# Patient Record
Sex: Male | Born: 1995 | Race: Asian | Hispanic: No | Marital: Single | Smoking: Current every day smoker
Health system: Southern US, Community
[De-identification: ages and names within clinical notes are randomized; demographics above are authoritative.]

---
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
## 2019-08-25 ENCOUNTER — Other Ambulatory Visit: Payer: Self-pay

## 2019-08-25 ENCOUNTER — Emergency Department: Payer: Self-pay

## 2019-08-25 ENCOUNTER — Emergency Department
Admission: EM | Admit: 2019-08-25 | Discharge: 2019-08-25 | Disposition: A | Discharge status: Home / Self Care | Payer: Self-pay | Attending: Emergency Medicine | Admitting: Emergency Medicine

## 2019-08-25 ENCOUNTER — Encounter: Payer: Self-pay | Admitting: Emergency Medicine

## 2019-08-25 DIAGNOSIS — R0789 Other chest pain: Secondary | ICD-10-CM | POA: Insufficient documentation

## 2019-08-25 DIAGNOSIS — F17210 Nicotine dependence, cigarettes, uncomplicated: Secondary | ICD-10-CM | POA: Insufficient documentation

## 2019-08-25 DIAGNOSIS — R0781 Pleurodynia: Secondary | ICD-10-CM

## 2019-08-25 MED ORDER — NAPROXEN 500 MG PO TABS: 500.0000 mg | ORAL_TABLET | Freq: Two times a day (BID) | ORAL | 0 refills | Status: DC

## 2019-08-25 MED ORDER — NAPROXEN 500 MG PO TABS
500.0000 mg | ORAL_TABLET | Freq: Once | ORAL | Status: AC
  Administered 2019-08-25: 500 mg via ORAL
  Filled 2019-08-25: qty 1

## 2019-08-25 NOTE — Discharge Instructions (Signed)
Follow-up with your primary care provider or can no clinic acute care if any continued problems.  Begin taking naproxen 500 mg twice daily with food.  Consider discontinuing or reducing your smoking.  Today you may also apply ice to your ribs to help with pain.

## 2019-08-25 NOTE — ED Notes (Signed)
Pt presentation discussed with EDP, Stafford; no new orders at this time. 

## 2019-08-25 NOTE — ED Notes (Signed)
Po meds given as ordered.

## 2019-08-25 NOTE — ED Provider Notes (Signed)
[facility] Emergency Department Provider Note   ____________________________________________   First MD Initiated Contact with Patient 08/25/19 1807     (approximate)  I have reviewed the triage vital signs and the nursing notes.   HISTORY  Chief Complaint Chest Pain   HPI William Ewing is a 23 y.o. male presents to the ED with complaint of rib pain that started when he woke up this morning.  Patient states there is been no injury while at work.  He has not taken any over-the-counter anti-inflammatories.  He states that he went to work and during the day he felt it again and went to the first-aid station who called EMS.  He states an EKG was done and he was told that it was normal.  He states that rib pain is worse with deep inspiration.  There is no radiation and pain is intermittent.  He denies any anterior chest wall pain, shortness of breath, nausea, diaphoresis or vomiting.  Patient denies any use of drugs recreationally.  Patient is a smoker at 1/2 pack cigarettes per day.  Currently rates his pain as 5/10.       History reviewed. No pertinent past medical history.  There are no active problems to display for this patient.   History reviewed. No pertinent surgical history.  Prior to Admission medications   Medication Sig Start Date End Date Taking? Authorizing Provider  naproxen (NAPROSYN) 500 MG tablet Take 1 tablet (500 mg total) by mouth 2 (two) times daily with a meal. 08/25/19   Tommi RumpsSummers, Rhonda L, PA-C    Allergies Patient has no known allergies.  No family history on file.  Social History Social History   Tobacco Use  . Smoking status: Current Every Day Smoker    Packs/day: 0.50    Types: Cigarettes  . Smokeless tobacco: Never Used  Substance Use Topics  . Alcohol use: Yes  . Drug use: Never    Review of Systems Constitutional: No fever/chills Eyes: No visual changes. ENT: No sore throat. Cardiovascular: Denies chest  pain. Respiratory: Denies shortness of breath.  Left lateral rib pain. Gastrointestinal: No abdominal pain.  No nausea, no vomiting.  No diarrhea.  No constipation. Genitourinary: Negative for dysuria. Musculoskeletal: Negative for back pain. Skin: Negative for rash. Neurological: Negative for headaches, focal weakness or numbness. ____________________________________________   PHYSICAL EXAM:  VITAL SIGNS: ED Triage Vitals  Enc Vitals Group     BP 08/25/19 1727 131/71     Pulse Rate 08/25/19 1727 75     Resp 08/25/19 1727 16     Temp 08/25/19 1727 98.2 F (36.8 C)     Temp Source 08/25/19 1727 Oral     SpO2 08/25/19 1727 97 %     Weight 08/25/19 1722 150 lb (68 kg)     Height 08/25/19 1722 5\' 10"  (1.778 m)     Head Circumference --      Peak Flow --      Pain Score 08/25/19 1722 5     Pain Loc --      Pain Edu? --      Excl. in [place]? --    Constitutional: Alert and oriented. Well appearing and in no acute distress. Eyes: Conjunctivae are normal.  Head: Atraumatic. Neck: No stridor.   Cardiovascular: Normal rate, regular rhythm. Grossly normal heart sounds.  Good peripheral circulation. Respiratory: Normal respiratory effort.  No retractions. Lungs CTAB.  No gross deformities noted on the left lateral ribs.  No soft tissue edema or discoloration is noted.  There is tenderness on palpation of the left lateral rib at approximately 8, ninth rib.  Range of motion increases patient's pain. Gastrointestinal: Soft and nontender. No distention. Musculoskeletal: Moves upper and lower extremities without any difficulty and normal gait was noted. Neurologic:  Normal speech and language. No gross focal neurologic deficits are appreciated. No gait instability. Skin:  Skin is warm, dry and intact. No rash noted. Psychiatric: Mood and affect are normal. Speech and behavior are normal.  ____________________________________________   LABS (all labs ordered are listed, but only abnormal  results are displayed)  Labs Reviewed - No data to display ____________________________________________  EKG  EKG was read by Dr. on Major.  EKG showed normal sinus rhythm with a ventricular rate of 77.  PR interval 146, QRS duration 100. ____________________________________________  RADIOLOGY  Official radiology report(s): Dg Chest 2 View  Result Date: 08/25/2019 CLINICAL DATA:  Acute LEFT chest and LOWER rib pain. EXAM: CHEST - 2 VIEW COMPARISON:  None. FINDINGS: The cardiomediastinal silhouette is unremarkable. There is no evidence of focal airspace disease, pulmonary edema, suspicious pulmonary nodule/mass, pleural effusion, or pneumothorax. No acute bony abnormalities are identified. IMPRESSION: No active cardiopulmonary disease. Electronically Signed   By: Margarette Canada M.D.   On: 08/25/2019 18:16    ____________________________________________   PROCEDURES  Procedure(s) performed (including Critical Care):  Procedures   ____________________________________________   INITIAL IMPRESSION / ASSESSMENT AND PLAN / ED COURSE  As part of my medical decision making, I reviewed the following data within the electronic MEDICAL RECORD NUMBER Notes from prior ED visits and [place] Controlled Substance Database  23 year old male presents to the ED with complaint of left lateral rib pain when he woke up this morning.  He denies any injury to this area.  He was at work when he became concerned and went to the first-aid station who called EMS.  They did an EKG that looked normal.  Patient was not transferred ported to the ED but came after work.  Chest x-ray was negative and EKG was reassuring.  Physical exam showed tenderness on palpation of the left lateral ribs approximately the eighth and ninth.  Patient was given naproxen in the ED and a prescription to continue with same.  He is to return to the emergency department if any severe worsening of his symptoms or urgent concerns.   ____________________________________________   FINAL CLINICAL IMPRESSION(S) / ED DIAGNOSES  Final diagnoses:  Rib pain on left side     ED Discharge Orders         Ordered    naproxen (NAPROSYN) 500 MG tablet  2 times daily with meals     08/25/19 1838           Note:  This document was prepared using Dragon voice recognition software and may include unintentional dictation errors.    Johnn Hai, PA-C 08/25/19 1911    Blake Divine, MD 08/25/19 2025

## 2019-08-25 NOTE — ED Triage Notes (Addendum)
Pt in via POV, reports intermittent left lower rib pain, denies radiation, denies associated symptoms.  States pain is sharp upon taking a deep breath, denies any recent injury.  Ambulatory to triage, NAD noted at this time.

## 2019-08-25 NOTE — ED Notes (Signed)
Sharp pain in chest area earlier today. Was told by ems his ekg was normal and he should go to ed and get a chest xray make sure his lungs are ok. Reports pain calm down only feels it when taking deep breaths.

## 2019-12-06 ENCOUNTER — Ambulatory Visit: Payer: Self-pay

## 2019-12-16 ENCOUNTER — Other Ambulatory Visit: Payer: Self-pay

## 2019-12-16 ENCOUNTER — Ambulatory Visit (LOCAL_COMMUNITY_HEALTH_CENTER): Payer: Self-pay

## 2019-12-16 ENCOUNTER — Ambulatory Visit: Payer: Self-pay | Admitting: Physician Assistant

## 2019-12-16 DIAGNOSIS — Z202 Contact with and (suspected) exposure to infections with a predominantly sexual mode of transmission: Secondary | ICD-10-CM

## 2019-12-16 DIAGNOSIS — Z23 Encounter for immunization: Secondary | ICD-10-CM

## 2019-12-16 DIAGNOSIS — Z113 Encounter for screening for infections with a predominantly sexual mode of transmission: Secondary | ICD-10-CM

## 2019-12-16 LAB — GRAM STAIN

## 2019-12-16 MED ORDER — AZITHROMYCIN 500 MG PO TABS
1000.0000 mg | ORAL_TABLET | Freq: Once | ORAL | Status: AC
  Administered 2019-12-16: 09:00:00 1000 mg via ORAL

## 2019-12-16 NOTE — Progress Notes (Signed)
   [facility] Department STI clinic/screening visit  Subjective:  William Ewing is a 23 y.o. male being seen today for an STI screening visit. The patient reports they do not have symptoms.    Patient has the following medical conditions:  There are no problems to display for this patient.    Chief Complaint  Patient presents with  . SEXUALLY TRANSMITTED DISEASE    HPI  Patient reports that he does not have any symptoms but is a contact to Chlamydia.  Declines blood work today, any chronic conditions and history of surgery.   See flowsheet for further details and programmatic requirements.    The following portions of the patient's history were reviewed and updated as appropriate: allergies, current medications, past medical history, past social history, past surgical history and problem list.  Objective:  There were no vitals filed for this visit.  Physical Exam Constitutional:      General: He is not in acute distress.    Appearance: Normal appearance. He is normal weight.  HENT:     Head: Normocephalic and atraumatic.     Comments: No nits, lice, or hair loss. No cervical, supraclavicular or axillary adenopathy.    Mouth/Throat:     Mouth: Mucous membranes are moist.     Pharynx: Oropharynx is clear. No oropharyngeal exudate or posterior oropharyngeal erythema.  Eyes:     Conjunctiva/sclera: Conjunctivae normal.  Pulmonary:     Effort: Pulmonary effort is normal.  Abdominal:     Palpations: Abdomen is soft. There is no mass.     Tenderness: There is no abdominal tenderness. There is no guarding or rebound.  Genitourinary:    Penis: Normal.      Testes: Normal.     Comments: Pubic area without nits, lice, edema, erythema, lesions and inguinal adenopathy. Penis circumcised and without discharge at meatus. Musculoskeletal:     Cervical back: Neck supple. No tenderness.  Skin:    General: Skin is warm and dry.     Findings: No bruising, erythema,  lesion or rash.  Neurological:     Mental Status: He is alert and oriented to person, place, and time.  Psychiatric:        Mood and Affect: Mood normal.        Thought Content: Thought content normal.        Judgment: Judgment normal.       Assessment and Plan:  William Ewing is a 23 y.o. male presenting to the [facility] Department for STI screening  1. Screening for STD (sexually transmitted disease) Patient into clinic without symptoms today.  Declines blood work today. Rec condoms with all sex. Await test results.  Counseled that RN will call if needs to RTC for further treatment once results are back. - Gram stain - Gonococcus culture - Gonococcus culture  2. Chlamydia contact Will treat as a contact to Chlamydia with Azithromycin 1 g po DOT today. No sex for 7 days and until after partner completes treatment. RTC for re-treatment if vomits <2 hr after taking medicine. - azithromycin (ZITHROMAX) tablet 1,000 mg  3.  Immunizations Patient requests tetanus update today. Please give as appropriate.    No follow-ups on file.  No future appointments.  Jerene Dilling, PA

## 2019-12-16 NOTE — Progress Notes (Signed)
Pt received Tdap vaccine today x1 per pt request and per Lauretta Chester, MD verbal order. Pt tolerated well.Ronny Bacon, RN

## 2019-12-16 NOTE — Progress Notes (Signed)
Gram stain reviewed and is negative today. Pt treated as a contact to Chlamydia per provider order. Pt received Tdap vaccine today per pt request, please see immunization encounter for today (12/16/2019) for details. Provider orders completed.Ronny Bacon, RN

## 2019-12-17 ENCOUNTER — Encounter: Payer: Self-pay | Admitting: Physician Assistant

## 2019-12-21 LAB — GONOCOCCUS CULTURE

## 2022-04-21 ENCOUNTER — Emergency Department: Payer: Commercial Managed Care - PPO

## 2022-04-21 ENCOUNTER — Emergency Department
Admission: EM | Admit: 2022-04-21 | Discharge: 2022-04-21 | Disposition: A | Discharge status: Home / Self Care | Payer: Commercial Managed Care - PPO | Attending: Emergency Medicine | Admitting: Emergency Medicine

## 2022-04-21 ENCOUNTER — Encounter: Payer: Self-pay | Admitting: Emergency Medicine

## 2022-04-21 ENCOUNTER — Other Ambulatory Visit: Payer: Self-pay

## 2022-04-21 DIAGNOSIS — M25512 Pain in left shoulder: Secondary | ICD-10-CM | POA: Insufficient documentation

## 2022-04-21 DIAGNOSIS — G8929 Other chronic pain: Secondary | ICD-10-CM | POA: Diagnosis not present

## 2022-04-21 MED ORDER — NAPROXEN 500 MG PO TABS: 500.0000 mg | ORAL_TABLET | Freq: Two times a day (BID) | ORAL | 0 refills | Status: AC

## 2022-04-21 NOTE — ED Triage Notes (Signed)
Pt via POV from home. Pt c/o L shoulder pain for the past 6 months. States he hears a "popping" noise every once in a while. States nothing happened today but he just decided to get it checked out today. Pt is A&Ox4 and NAD ?

## 2022-04-21 NOTE — ED Notes (Signed)
See triage note  presents with left shoulder pain for about 6 months  denies any falls  states he has increased pain with movement  no deformity noted  good pulses ?

## 2022-04-21 NOTE — ED Provider Notes (Signed)
? ?[facility] ?Provider Note ? ? ? Event Date/Time  ? First MD Initiated Contact with Patient 04/21/22 0848   ?  (approximate) ? ? ?History  ? ?Shoulder Pain ? ? ?HPI ? ?William Ewing is a 26 y.o. male presents to the ED with complaint of left shoulder pain for 6 months.  Patient is dates that he hears a "popping noise".  He denies any difficulty with range of motion.  No over-the-counter medications have been taken.  This is the first visit for evaluation of this problem. ?  ? ? ?Physical Exam  ? ?Triage Vital Signs: ?ED Triage Vitals  ?Enc Vitals Group  ?   BP 04/21/22 0843 (!) 141/71  ?   Pulse Rate 04/21/22 0843 75  ?   Resp 04/21/22 0843 15  ?   Temp 04/21/22 0843 (!) 97.5 ?F (36.4 ?C)  ?   Temp Source 04/21/22 0843 Oral  ?   SpO2 04/21/22 0843 96 %  ?   Weight 04/21/22 0834 185 lb (83.9 kg)  ?   Height 04/21/22 0834 5\' 10"  (1.778 m)  ?   Head Circumference --   ?   Peak Flow --   ?   Pain Score 04/21/22 0834 2  ?   Pain Loc --   ?   Pain Edu? --   ?   Excl. in [place]? --   ? ? ?Most recent vital signs: ?Vitals:  ? 04/21/22 0843  ?BP: (!) 141/71  ?Pulse: 75  ?Resp: 15  ?Temp: (!) 97.5 ?F (36.4 ?C)  ?SpO2: 96%  ? ? ? ?General: Awake, no distress.  ?CV:  Good peripheral perfusion.  Heart regular rate and rhythm. ?Resp:  Normal effort.  Lungs are clear bilaterally. ?Abd:  No distention.  ?Other:  Left shoulder no gross deformity is noted.  Range of motion is without restriction.  There is occasional crepitus with range of motion that is not consistent.   ? ? ?ED Results / Procedures / Treatments  ? ?Labs ?(all labs ordered are listed, but only abnormal results are displayed) ?Labs Reviewed - No data to display ? ? ? ?RADIOLOGY ?Left shoulder x-ray images were reviewed independently of the radiologist and no fracture, dislocation or osteophytes noted. ? ? ? ?PROCEDURES: ? ?Critical Care performed:  ? ?Procedures ? ? ?MEDICATIONS ORDERED IN ED: ?Medications - No data to display ? ? ?IMPRESSION  / MDM / ASSESSMENT AND PLAN / ED COURSE  ?I reviewed the triage vital signs and the nursing notes. ? ? ?Differential diagnosis includes, but is not limited to, chronic left shoulder pain, left shoulder osteoarthritis. ? ?26 year old male presents to the ED with complaint of a popping in his left shoulder that has been going on for approximately 6 months without history of injury.  Patient does not take any over-the-counter medication for this.  Physical exam was benign with the exception of some minimal crepitus that is not consistent.  X-ray of the left shoulder was negative for bony abnormalities.  We discussed over-the-counter medication however a prescription for naproxen 500 mg twice daily was sent to his pharmacy.  He was told that this is not bone on bone that he is experiencing.  He is encouraged to follow-up with Dr. Leim Fabry who is on-call for orthopedics if he continues having problems in the future. ? ? ? ?  ? ? ?FINAL CLINICAL IMPRESSION(S) / ED DIAGNOSES  ? ?Final diagnoses:  ?Chronic left shoulder pain  ? ? ? ?  Rx / DC Orders  ? ?ED Discharge Orders   ? ?      Ordered  ?  naproxen (NAPROSYN) 500 MG tablet  2 times daily with meals       ? 04/21/22 0937  ? ?  ?  ? ?  ? ? ? ?Note:  This document was prepared using Dragon voice recognition software and may include unintentional dictation errors. ?  ?Johnn Hai, PA-C ?04/21/22 [id number] ? ?  ?Vanessa [place], MD ?04/25/22 1515 ? ?

## 2022-04-21 NOTE — Discharge Instructions (Signed)
Follow-up with Dr. Signa Kell who is on-call for orthopedics if not improving or still giving you difficulties after 3 to 4 weeks.  You will need to call make an appointment.  Naproxen 500 mg twice daily with food was sent to your pharmacy. ?

## 2022-09-01 IMAGING — CR DG SHOULDER 2+V*L*
1 series · 3 of 3 positions shown · non-contrast
Comparison: None.

CLINICAL DATA: Left shoulder pain

EXAM:
LEFT SHOULDER - 2+ VIEW

[Series 1: dg shoulder left · 0.14mm/px · 3 of 3 slices shown]
[im 1/3]
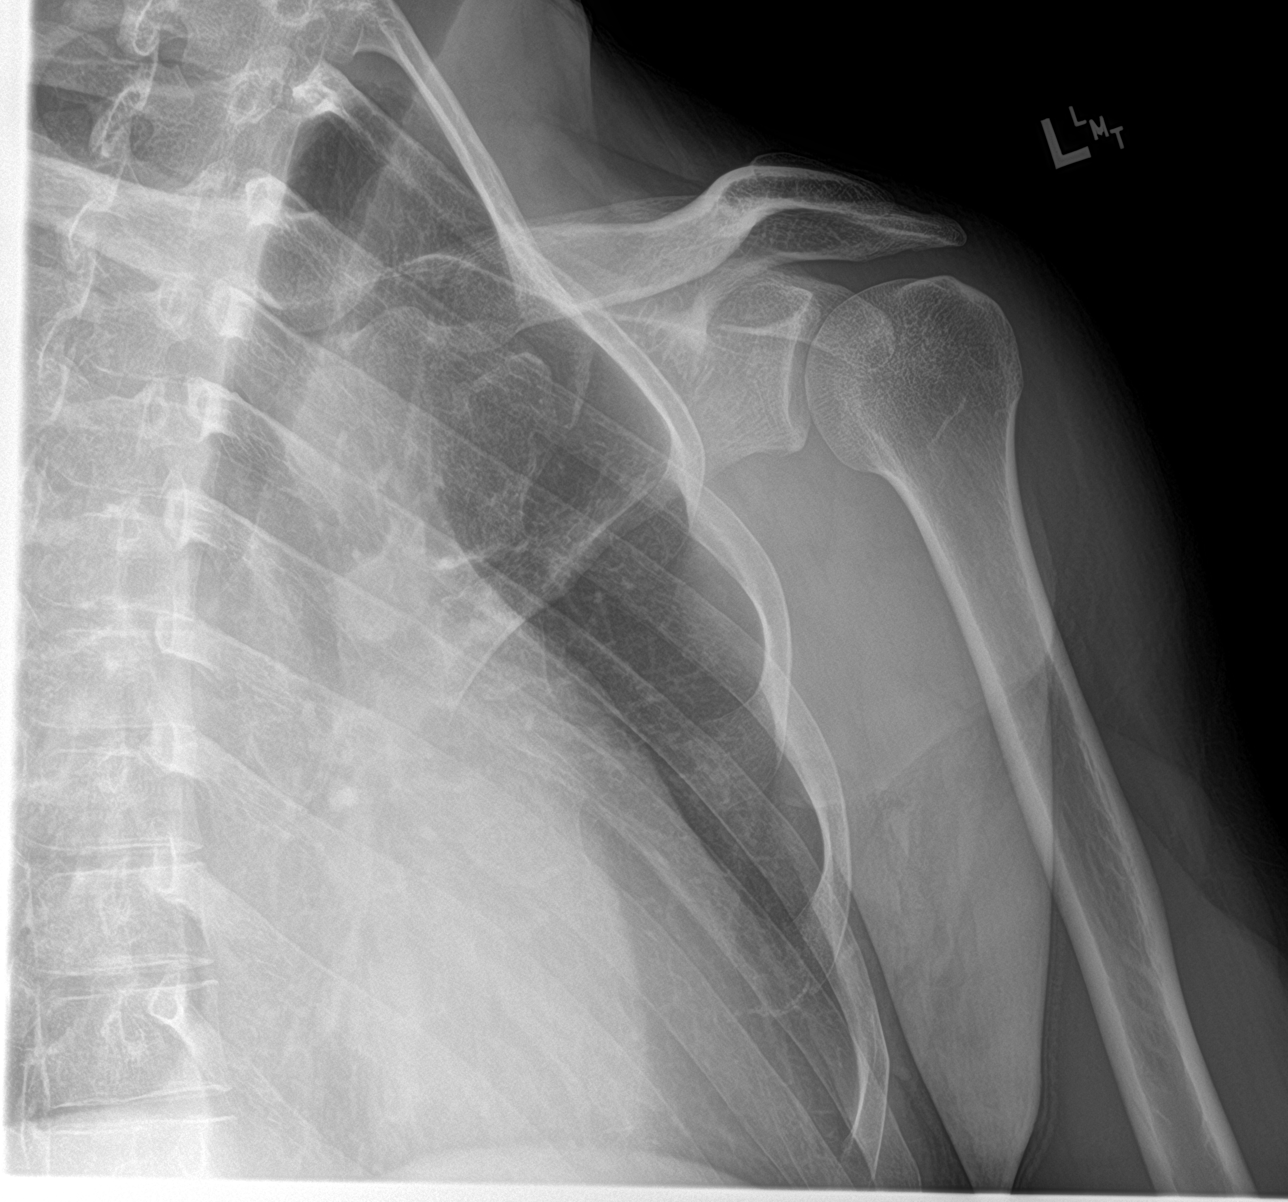
[im 2/3]
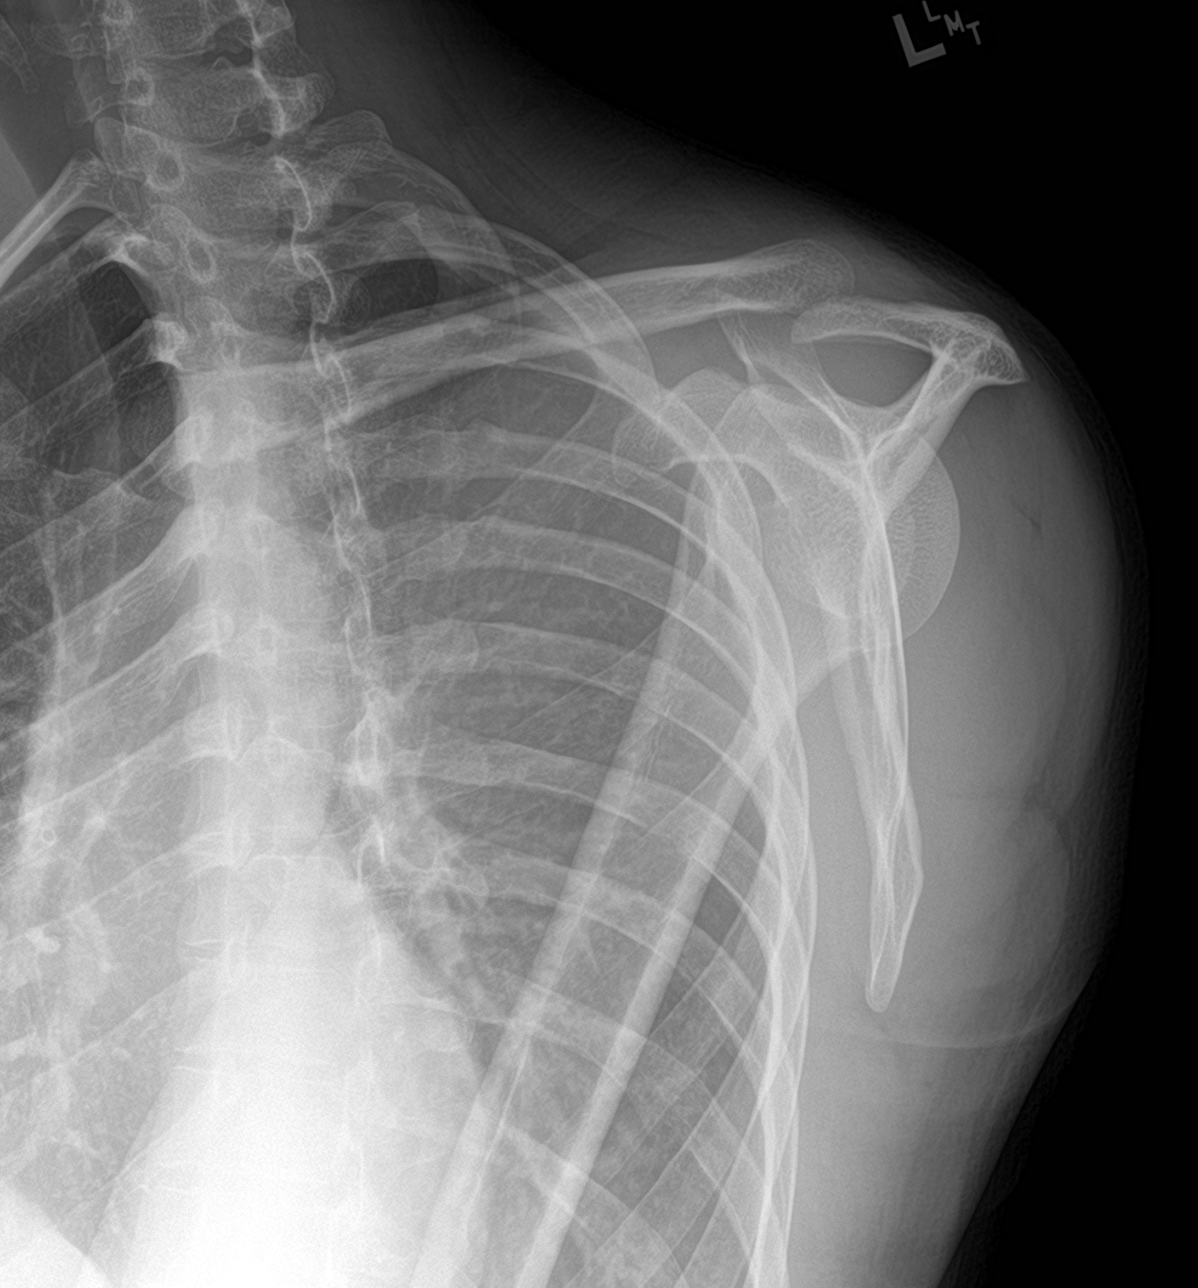
[im 3/3]
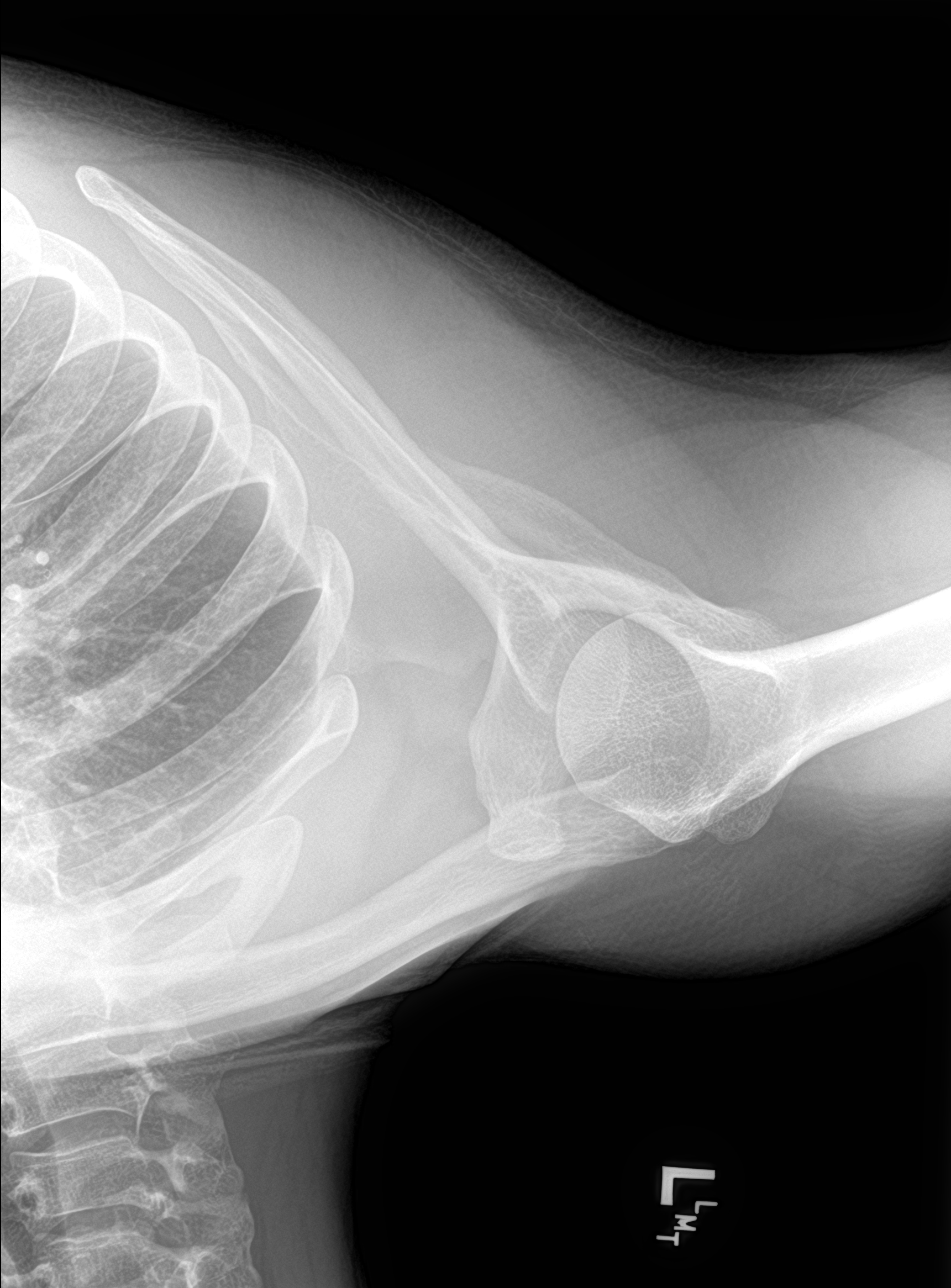

[3 of 3 positions shown; findings below may reference images not displayed]

FINDINGS: There is no evidence of fracture or dislocation. There is no
evidence of arthropathy or other focal bone abnormality. Soft
tissues are unremarkable.
IMPRESSION: Negative left shoulder radiographs.

## 2023-03-22 ENCOUNTER — Emergency Department
Admission: EM | Admit: 2023-03-22 | Discharge: 2023-03-22 | Disposition: A | Discharge status: Home / Self Care | Payer: Commercial Managed Care - PPO | Attending: Emergency Medicine | Admitting: Emergency Medicine

## 2023-03-22 DIAGNOSIS — F1014 Alcohol abuse with alcohol-induced mood disorder: Secondary | ICD-10-CM | POA: Diagnosis present

## 2023-03-22 DIAGNOSIS — R45851 Suicidal ideations: Secondary | ICD-10-CM

## 2023-03-22 DIAGNOSIS — D72829 Elevated white blood cell count, unspecified: Secondary | ICD-10-CM | POA: Insufficient documentation

## 2023-03-22 DIAGNOSIS — Z046 Encounter for general psychiatric examination, requested by authority: Secondary | ICD-10-CM

## 2023-03-22 DIAGNOSIS — F10920 Alcohol use, unspecified with intoxication, uncomplicated: Secondary | ICD-10-CM

## 2023-03-22 DIAGNOSIS — Y907 Blood alcohol level of 200-239 mg/100 ml: Secondary | ICD-10-CM | POA: Insufficient documentation

## 2023-03-22 LAB — CBC WITH DIFFERENTIAL/PLATELET
Abs Immature Granulocytes: 0.04 10*3/uL (ref 0.00–0.07)
Basophils Absolute: 0.1 10*3/uL (ref 0.0–0.1)
Basophils Relative: 1 %
Eosinophils Absolute: 0 10*3/uL (ref 0.0–0.5)
Eosinophils Relative: 0 %
HCT: 44.1 % (ref 39.0–52.0)
Hemoglobin: 14.7 g/dL (ref 13.0–17.0)
Immature Granulocytes: 0 %
Lymphocytes Relative: 21 %
Lymphs Abs: 2.8 10*3/uL (ref 0.7–4.0)
MCH: 29.6 pg (ref 26.0–34.0)
MCHC: 33.3 g/dL (ref 30.0–36.0)
MCV: 88.7 fL (ref 80.0–100.0)
Monocytes Absolute: 0.5 10*3/uL (ref 0.1–1.0)
Monocytes Relative: 4 %
Neutro Abs: 9.6 10*3/uL — ABNORMAL HIGH (ref 1.7–7.7)
Neutrophils Relative %: 74 %
Platelets: 182 10*3/uL (ref 150–400)
RBC: 4.97 MIL/uL (ref 4.22–5.81)
RDW: 13.2 % (ref 11.5–15.5)
WBC: 12.9 10*3/uL — ABNORMAL HIGH (ref 4.0–10.5)
nRBC: 0 % (ref 0.0–0.2)

## 2023-03-22 LAB — COMPREHENSIVE METABOLIC PANEL
ALT: 23 U/L (ref 0–44)
AST: 20 U/L (ref 15–41)
Albumin: 4.6 g/dL (ref 3.5–5.0)
Alkaline Phosphatase: 61 U/L (ref 38–126)
Anion gap: 7 (ref 5–15)
BUN: 15 mg/dL (ref 6–20)
CO2: 20 mmol/L — ABNORMAL LOW (ref 22–32)
Calcium: 9 mg/dL (ref 8.9–10.3)
Chloride: 114 mmol/L — ABNORMAL HIGH (ref 98–111)
Creatinine, Ser: 1 mg/dL (ref 0.61–1.24)
GFR, Estimated: >60 mL/min (ref >60)
Glucose, Bld: 110 mg/dL — ABNORMAL HIGH (ref 70–99)
Potassium: 3.7 mmol/L (ref 3.5–5.1)
Sodium: 141 mmol/L (ref 135–145)
Total Bilirubin: 0.4 mg/dL (ref 0.3–1.2)
Total Protein: 8 g/dL (ref 6.5–8.1)

## 2023-03-22 LAB — SALICYLATE LEVEL: Salicylate Lvl: <7 mg/dL — ABNORMAL LOW (ref 7.0–30.0)

## 2023-03-22 LAB — ACETAMINOPHEN LEVEL: Acetaminophen (Tylenol), Serum: <10 ug/mL — ABNORMAL LOW (ref 10–30)

## 2023-03-22 LAB — ETHANOL: Alcohol, Ethyl (B): 208 mg/dL — ABNORMAL HIGH (ref <10)

## 2023-03-22 NOTE — ED Notes (Signed)
This RN attempted to have patient changed. Pt not cooperative and cussing at this RN. PT states he is not getting naked for anyone. This RN and police attempted to explain the policy and process, and patient refused. Pt stating being here for 2 hours and wants to go home. Police stated pt is under IVC. This RN informed patient that this RN is unable to let him leave, but is trying to help the process. Pt continued to yell and cuss. First nurse informed.  Pt refusing to dress out into hospital approved clothing/ purple scrubs.

## 2023-03-22 NOTE — ED Notes (Signed)
Pts Father picked him up, security escorted pt to the exit.

## 2023-03-22 NOTE — ED Notes (Signed)
Pt continues to scream and yell and being disruptive in the subwait area. Instructed officers and pt to go into the family room at this time.

## 2023-03-22 NOTE — Consult Note (Signed)
[facility] Consult   Reason for Consult:  alcohol intoxication with suicide comment upon arrest Referring Physician:  EDP Patient Identification: William Ewing MRN:  [id number] Principal Diagnosis: Alcohol abuse with alcohol-induced mood disorder ([place]) Diagnosis:  Principal Problem:   Alcohol abuse with alcohol-induced mood disorder ([place])   Total Time spent with patient: 45 minutes  Subjective:   William Ewing is a 27 y.o. male patient admitted with suicide statement in the process of being arrested for a DWI.  HPI:  27 yo male presented to the ED by police after he was stopped and arrested for a DWI.  "They just pissed me off and I said things I didn't mean.  I'm not going to hurt myself, I love myself.  I just want to go home and go to bed."  The client denies suicidal/homicidal ideations, hallucinations, and history of withdrawal complications including seizures.  No past suicide attempts or hospitalizations or mental health issues.  Psych cleared.  Past Psychiatric History: none  Risk to Self:  none Risk to Others:  none Prior Inpatient Therapy:  none Prior Outpatient Therapy:  none  Past Medical History: No past medical history on file. No past surgical history on file. Family History: No family history on file. Family Psychiatric  History: denies Social History:  Social History   Substance and Sexual Activity  Alcohol Use Yes     Social History   Substance and Sexual Activity  Drug Use Never    Social History   Socioeconomic History   Marital status: Single    Spouse name: Not on file   Number of children: Not on file   Years of education: Not on file   Highest education level: Not on file  Occupational History   Not on file  Tobacco Use   Smoking status: Every Day    Packs/day: .5    Types: Cigarettes   Smokeless tobacco: Never  Vaping Use   Vaping Use: Never used  Substance and Sexual Activity   Alcohol use: Yes   Drug use: Never    Sexual activity: Yes    Birth control/protection: Condom  Other Topics Concern   Not on file  Social History Narrative   Not on file   Social Determinants of Health   Financial Resource Strain: Not on file  Food Insecurity: Not on file  Transportation Needs: Not on file  Physical Activity: Not on file  Stress: Not on file  Social Connections: Not on file   Additional Social History:    Allergies:  No Known Allergies  Labs:  Results for orders placed or performed during the hospital encounter of 03/22/23 (from the past 48 hour(s))  Comprehensive metabolic panel     Status: Abnormal   Collection Time: 03/22/23  5:56 AM  Result Value Ref Range   Sodium 141 135 - 145 mmol/L   Potassium 3.7 3.5 - 5.1 mmol/L   Chloride 114 (H) 98 - 111 mmol/L   CO2 20 (L) 22 - 32 mmol/L   Glucose, Bld 110 (H) 70 - 99 mg/dL    Comment: Glucose reference range applies only to samples taken after fasting for at least 8 hours.   BUN 15 6 - 20 mg/dL   Creatinine, Ser 1.00 0.61 - 1.24 mg/dL   Calcium 9.0 8.9 - 10.3 mg/dL   Total Protein 8.0 6.5 - 8.1 g/dL   Albumin 4.6 3.5 - 5.0 g/dL   AST 20 15 - 41 U/L  ALT 23 0 - 44 U/L   Alkaline Phosphatase 61 38 - 126 U/L   Total Bilirubin 0.4 0.3 - 1.2 mg/dL   GFR, Estimated >60 >60 mL/min    Comment: (NOTE) Calculated using the CKD-EPI Creatinine Equation (2021)    Anion gap 7 5 - 15    Comment: Performed at [facility], [address] [place], [place] [address]  Ethanol     Status: Abnormal   Collection Time: 03/22/23  5:56 AM  Result Value Ref Range   Alcohol, Ethyl (B) 208 (H) <10 mg/dL    Comment: (NOTE) Lowest detectable limit for serum alcohol is 10 mg/dL.  For medical purposes only. Performed at [facility], [address] [place], [place] [address]   CBC with Diff     Status: Abnormal   Collection Time: 03/22/23  5:56 AM  Result Value Ref Range   WBC 12.9 (H) 4.0 - 10.5 K/uL   RBC 4.97 4.22 - 5.81 MIL/uL    Hemoglobin 14.7 13.0 - 17.0 g/dL   HCT 44.1 39.0 - 52.0 %   MCV 88.7 80.0 - 100.0 fL   MCH 29.6 26.0 - 34.0 pg   MCHC 33.3 30.0 - 36.0 g/dL   RDW 13.2 11.5 - 15.5 %   Platelets 182 150 - 400 K/uL   nRBC 0.0 0.0 - 0.2 %   Neutrophils Relative % 74 %   Neutro Abs 9.6 (H) 1.7 - 7.7 K/uL   Lymphocytes Relative 21 %   Lymphs Abs 2.8 0.7 - 4.0 K/uL   Monocytes Relative 4 %   Monocytes Absolute 0.5 0.1 - 1.0 K/uL   Eosinophils Relative 0 %   Eosinophils Absolute 0.0 0.0 - 0.5 K/uL   Basophils Relative 1 %   Basophils Absolute 0.1 0.0 - 0.1 K/uL   Immature Granulocytes 0 %   Abs Immature Granulocytes 0.04 0.00 - 0.07 K/uL    Comment: Performed at [facility], [address] [place], [place] [id number]  Salicylate level     Status: Abnormal   Collection Time: 03/22/23  5:56 AM  Result Value Ref Range   Salicylate Lvl Q000111Q (L) 7.0 - 30.0 mg/dL    Comment: Performed at [facility], [address] [place], [place] [address]  Acetaminophen level     Status: Abnormal   Collection Time: 03/22/23  5:56 AM  Result Value Ref Range   Acetaminophen (Tylenol), Serum <10 (L) 10 - 30 ug/mL    Comment: (NOTE) Therapeutic concentrations vary significantly. A range of 10-30 ug/mL  may be an effective concentration for many patients. However, some  are best treated at concentrations outside of this range. Acetaminophen concentrations >150 ug/mL at 4 hours after ingestion  and >50 ug/mL at 12 hours after ingestion are often associated with  toxic reactions.  Performed at [facility], [address] [place],  [address]     No current facility-administered medications for this encounter.   Current Outpatient Medications  Medication Sig Dispense Refill   naproxen (NAPROSYN) 500 MG tablet Take 1 tablet (500 mg total) by mouth 2 (two) times daily with a meal. 30 tablet 0    Musculoskeletal: Strength & Muscle Tone: within normal limits Gait & Station:  normal Patient leans: N/A  Psychiatric Specialty Exam: Physical Exam Vitals and nursing note reviewed.  Constitutional:      Appearance: Normal appearance.  HENT:     Head: Normocephalic.     Nose: Nose normal.  Pulmonary:     Effort: Pulmonary effort is normal.  Musculoskeletal:        General: Normal range of motion.     Cervical back: Normal range of motion.  Neurological:     General: No focal deficit present.     Mental Status: He is alert and oriented to person, place, and time.  Psychiatric:        Attention and Perception: Attention and perception normal.        Mood and Affect: Mood is anxious.        Speech: Speech normal.        Behavior: Behavior normal. Behavior is cooperative.        Thought Content: Thought content normal.        Cognition and Memory: Cognition and memory normal.        Judgment: Judgment normal.     Review of Systems  Psychiatric/Behavioral:  Positive for substance abuse. The patient is nervous/anxious.   All other systems reviewed and are negative.   Blood pressure (!) 136/90, pulse (!) 115, temperature 97.9 F (36.6 C), temperature source Oral, resp. rate 20, height 5\' 10"  (1.778 m), weight 72.6 kg, SpO2 98 %.Body mass index is 22.96 kg/m.  General Appearance: Disheveled  Eye Contact:  Fair  Speech:  Normal Rate  Volume:  Normal  Mood:  Anxious and Irritable  Affect:  Congruent  Thought Process:  Coherent  Orientation:  Full (Time, Place, and Person)  Thought Content:  WDL and Logical  Suicidal Thoughts:  No  Homicidal Thoughts:  No  Memory:  Immediate;   Good Recent;   Good Remote;   Good  Judgement:  Fair  Insight:  Fair  Psychomotor Activity:  Normal  Concentration:  Concentration: Fair and Attention Span: Fair  Recall:  [employer] of Knowledge:  Fair  Language:  Good  Akathisia:  No  Handed:  Right  AIMS (if indicated):     Assets:  Housing Leisure Time Physical Health Resilience Social Support  ADL's:  Intact   Cognition:  WNL  Sleep:        Physical Exam: Physical Exam Vitals and nursing note reviewed.  Constitutional:      Appearance: Normal appearance.  HENT:     Head: Normocephalic.     Nose: Nose normal.  Pulmonary:     Effort: Pulmonary effort is normal.  Musculoskeletal:        General: Normal range of motion.     Cervical back: Normal range of motion.  Neurological:     General: No focal deficit present.     Mental Status: He is alert and oriented to person, place, and time.  Psychiatric:        Attention and Perception: Attention and perception normal.        Mood and Affect: Mood is anxious.        Speech: Speech normal.        Behavior: Behavior normal. Behavior is cooperative.        Thought Content: Thought content normal.        Cognition and Memory: Cognition and memory normal.        Judgment: Judgment normal.    Review of Systems  Psychiatric/Behavioral:  Positive for substance abuse. The patient is nervous/anxious.   All other systems reviewed and are negative.  Blood pressure (!) 136/90, pulse (!) 115, temperature 97.9 F (36.6 C), temperature source Oral, resp. rate 20, height 5\' 10"  (1.778 m), weight  72.6 kg, SpO2 98 %. Body mass index is 22.96 kg/m.  Treatment Plan Summary: Alcohol abuse with alcohol induced mood disorder: Follow up with [facility]  Disposition: No evidence of imminent risk to self or others at present.   Patient does not meet criteria for psychiatric inpatient admission. Supportive therapy provided about ongoing stressors.  Waylan Boga, NP 03/22/2023 9:54 AM

## 2023-03-22 NOTE — ED Notes (Signed)
Patient verbally aggressive with staff, but has agreed to blood work while awaiting treatment room.

## 2023-03-22 NOTE — ED Provider Notes (Signed)
Patient with cleared by psychiatry for discharge home.  Will make sure that he has a safe ride home.  IVC was reversed by them.   Vanessa Shonto, MD 03/22/23 [phone]

## 2023-03-22 NOTE — ED Notes (Signed)
Patient is refusing vitals, blood work, or being dressed out at this time

## 2023-03-22 NOTE — ED Notes (Signed)
Patient was compliant with lab draws and vitals. Due to safety concerns, patient must remain in handcuffs per officers, so unable to be dressed out at this time.

## 2023-03-22 NOTE — ED Notes (Signed)
Pt refused to dress out at this time.

## 2023-03-22 NOTE — ED Notes (Signed)
Pt belongings given by [employer] include:  2 black phone Barrister's clerk Black wallet

## 2023-03-22 NOTE — ED Notes (Signed)
Ride almost here. Spoke with Tylers dad.

## 2023-03-22 NOTE — ED Provider Notes (Signed)
[facility] Provider Note    Event Date/Time   First MD Initiated Contact with Patient 03/22/23 0559     (approximate)   History   Psychiatric Evaluation   HPI  William Ewing is a 27 y.o. male with no known past medical history who presents to the emergency department in police custody.  Patient is under IVC.  Patient was arrested for DWI tonight and taken to jail.  Patient repeatedly stating that he was going to kill himself which was witnessed by multiple police and staff members at the jail and "they would not have to worry about him anymore".  He admits to having access to guns, knives.  He denies SI currently, HI but is extremely intoxicated, agitated and difficult to redirect.   History provided by patient, police.    No past medical history on file.  No past surgical history on file.  MEDICATIONS:  Prior to Admission medications   Medication Sig Start Date End Date Taking? Authorizing Provider  naproxen (NAPROSYN) 500 MG tablet Take 1 tablet (500 mg total) by mouth 2 (two) times daily with a meal. 04/21/22   Johnn Hai, PA-C    Physical Exam   Triage Vital Signs: ED Triage Vitals  Enc Vitals Group     BP 03/22/23 0603 (!) 136/90     Pulse Rate 03/22/23 0603 (!) 115     Resp 03/22/23 0603 20     Temp 03/22/23 0603 97.9 F (36.6 C)     Temp Source 03/22/23 0603 Oral     SpO2 03/22/23 0603 98 %     Weight 03/22/23 0538 160 lb (72.6 kg)     Height 03/22/23 0538 5\' 10"  (1.778 m)     Head Circumference --      Peak Flow --      Pain Score --      Pain Loc --      Pain Edu? --      Excl. in [place]? --     Most recent vital signs: Vitals:   03/22/23 0603  BP: (!) 136/90  Pulse: (!) 115  Resp: 20  Temp: 97.9 F (36.6 C)  SpO2: 98%    CONSTITUTIONAL: Alert, responds appropriately to questions. Well-appearing; well-nourished, smells strongly of alcohol HEAD: Normocephalic, atraumatic EYES: Conjunctivae clear, pupils appear  equal, sclera nonicteric ENT: normal nose; moist mucous membranes NECK: Supple, normal ROM CARD: Regular and tachycardic; S1 and S2 appreciated RESP: Normal chest excursion without splinting or tachypnea; breath sounds clear and equal bilaterally; no wheezes, no rhonchi, no rales, no hypoxia or respiratory distress, speaking full sentences ABD/GI: Non-distended; soft, non-tender, no rebound, no guarding, no peritoneal signs BACK: The back appears normal EXT: Normal ROM in all joints; no deformity noted, no edema SKIN: Normal color for age and race; warm; no rash on exposed skin NEURO: Moves all extremities equally, slurred speech, ambulates with normal gait PSYCH: Agitated, loud, difficult to redirect.  Denies SI, HI.   ED Results / Procedures / Treatments   LABS: (all labs ordered are listed, but only abnormal results are displayed) Labs Reviewed  COMPREHENSIVE METABOLIC PANEL - Abnormal; Notable for the following components:      Result Value   Chloride 114 (*)    CO2 20 (*)    Glucose, Bld 110 (*)    All other components within normal limits  ETHANOL - Abnormal; Notable for the following components:   Alcohol, Ethyl (B) 208 (*)  All other components within normal limits  CBC WITH DIFFERENTIAL/PLATELET - Abnormal; Notable for the following components:   WBC 12.9 (*)    Neutro Abs 9.6 (*)    All other components within normal limits  SALICYLATE LEVEL - Abnormal; Notable for the following components:   Salicylate Lvl Q000111Q (*)    All other components within normal limits  ACETAMINOPHEN LEVEL - Abnormal; Notable for the following components:   Acetaminophen (Tylenol), Serum <10 (*)    All other components within normal limits  URINE DRUG SCREEN, QUALITATIVE ([facility] ONLY)     EKG:   RADIOLOGY: My personal review and interpretation of imaging:    I have personally reviewed all radiology reports.   No results found.   PROCEDURES:  Critical Care performed:  No      Procedures    IMPRESSION / MDM / ASSESSMENT AND PLAN / ED COURSE  I reviewed the triage vital signs and the nursing notes.    Patient here under IVC by [facility] Department after he made multiple statements regarding having suicidal thoughts after being arrested and placed in jail for DWI.     DIFFERENTIAL DIAGNOSIS (includes but not limited to):   Alcohol intoxication, malingering, depression, suicidal thoughts, aggressive behavior   Patient's presentation is most consistent with acute presentation with potential threat to life or bodily function.   PLAN: Will obtain screening labs, urine.  Patient is under full IVC.  Given these statements that he made to multiple staff members at jail (although likely in the setting of being intoxicated and frustrated that he was arrested for DWI) with access to weapons, will consult psychiatry and TTS for further evaluation and disposition.  Police present with patient as he is currently stated and in custody.   MEDICATIONS GIVEN IN ED: Medications - No data to display   ED COURSE: Labs show slight leukocytosis likely reactive.  Alcohol level of 208.    Tylenol and salicylate levels negative.    Drug screen pending.    Patient medically cleared and awaiting psychiatric evaluation.   CONSULTS: TTS and psychiatry consulted for further disposition.   OUTSIDE RECORDS REVIEWED: Reviewed last internal medicine note by Sarajane Jews on 03/11/2023.       FINAL CLINICAL IMPRESSION(S) / ED DIAGNOSES   Final diagnoses:  Suicidal ideation  Involuntary commitment  Alcoholic intoxication without complication ([place])     Rx / DC Orders   ED Discharge Orders     None        Note:  This document was prepared using Dragon voice recognition software and may include unintentional dictation errors.   Talulah Schirmer, Delice Bison, DO 03/22/23 [phone]

## 2023-03-22 NOTE — Discharge Instructions (Signed)
Cleared by psychiatry to be discharged to a ride.

## 2023-03-22 NOTE — ED Notes (Signed)
Called for a ride. Waiting on their arrival for this DC

## 2023-03-22 NOTE — ED Triage Notes (Signed)
Patient arrived with [facility] after being charged with a DWI. While patient was in custody, he endorsed suicidal ideations. Patient denies suicidal and homicidal ideations at this time. He is also refusing vitals at this time.

## 2023-09-23 ENCOUNTER — Emergency Department
Admission: EM | Admit: 2023-09-23 | Discharge: 2023-09-24 | Disposition: A | Discharge status: Home / Self Care | Payer: Commercial Managed Care - PPO | Attending: Emergency Medicine | Admitting: Emergency Medicine

## 2023-09-23 DIAGNOSIS — Y92410 Unspecified street and highway as the place of occurrence of the external cause: Secondary | ICD-10-CM | POA: Insufficient documentation

## 2023-09-23 DIAGNOSIS — S40812A Abrasion of left upper arm, initial encounter: Secondary | ICD-10-CM | POA: Insufficient documentation

## 2023-09-23 DIAGNOSIS — Z79899 Other long term (current) drug therapy: Secondary | ICD-10-CM | POA: Diagnosis not present

## 2023-09-23 DIAGNOSIS — S0990XA Unspecified injury of head, initial encounter: Secondary | ICD-10-CM | POA: Insufficient documentation

## 2023-09-23 DIAGNOSIS — S40811A Abrasion of right upper arm, initial encounter: Secondary | ICD-10-CM | POA: Diagnosis not present

## 2023-09-23 DIAGNOSIS — S4991XA Unspecified injury of right shoulder and upper arm, initial encounter: Secondary | ICD-10-CM | POA: Diagnosis present

## 2023-09-23 DIAGNOSIS — S299XXA Unspecified injury of thorax, initial encounter: Secondary | ICD-10-CM | POA: Insufficient documentation

## 2023-09-23 DIAGNOSIS — Y906 Blood alcohol level of 120-199 mg/100 ml: Secondary | ICD-10-CM | POA: Diagnosis not present

## 2023-09-23 DIAGNOSIS — S3991XA Unspecified injury of abdomen, initial encounter: Secondary | ICD-10-CM | POA: Diagnosis not present

## 2023-09-23 DIAGNOSIS — F10920 Alcohol use, unspecified with intoxication, uncomplicated: Secondary | ICD-10-CM | POA: Diagnosis not present

## 2023-09-23 MED ORDER — LACTATED RINGERS IV BOLUS
1000.0000 mL | Freq: Once | INTRAVENOUS | Status: AC
  Administered 2023-09-24: 1000 mL via INTRAVENOUS
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
## 2023-09-23 NOTE — ED Provider Notes (Signed)
   [facility] Provider Note    Event Date/Time   First MD Initiated Contact with Patient 09/23/23 2343     (approximate)   History   Chief Complaint No chief complaint on file.   HPI  William Ewing is a 27 y.o. male ***       Physical Exam   Triage Vital Signs: ED Triage Vitals  Encounter Vitals Group     BP      Systolic BP Percentile      Diastolic BP Percentile      Pulse      Resp      Temp      Temp src      SpO2      Weight      Height      Head Circumference      Peak Flow      Pain Score      Pain Loc      Pain Education      Exclude from Growth Chart     Most recent vital signs: There were no vitals filed for this visit.  Constitutional: Alert and oriented. Eyes: Conjunctivae are normal. Head: Atraumatic. Nose: No congestion/rhinnorhea. Mouth/Throat: Mucous membranes are moist.  Neck: *** Cardiovascular: Normal rate, regular rhythm. Grossly normal heart sounds.  2+ radial pulses bilaterally. Respiratory: Normal respiratory effort.  No retractions. Lungs CTAB. Gastrointestinal: Soft and nontender. No distention. Genitourinary: *** Musculoskeletal: No lower extremity tenderness nor edema.  Neurologic:  Normal speech and language. No gross focal neurologic deficits are appreciated.    ED Results / Procedures / Treatments   Labs (all labs ordered are listed, but only abnormal results are displayed) Labs Reviewed - No data to display   EKG  ED ECG REPORT I, Chesley Noon, the attending physician, personally viewed and interpreted this ECG.   Date: 09/23/2023  EKG Time: ***  Rate: ***  Rhythm: {ekg findings:315101}  Axis: ***  Intervals:{conduction defects:17367}  ST&T Change: ***  RADIOLOGY ***  PROCEDURES:  Critical Care performed: {CriticalCareYesNo:19197::"Yes, see critical care procedure note(s)","No"}  Procedures   MEDICATIONS ORDERED IN ED: Medications - No data to  display   IMPRESSION / MDM / ASSESSMENT AND PLAN / ED COURSE  I reviewed the triage vital signs and the nursing notes.                              27 y.o. male ***   Patient's presentation is most consistent with {EM COPA:27473}  Differential diagnosis includes, but is not limited to, ***  ***  {**The patient is on the cardiac monitor to evaluate for evidence of arrhythmia and/or significant heart rate changes.**}      FINAL CLINICAL IMPRESSION(S) / ED DIAGNOSES   Final diagnoses:  None     Rx / DC Orders   ED Discharge Orders     None        Note:  This document was prepared using Dragon voice recognition software and may include unintentional dictation errors.

## 2023-09-24 ENCOUNTER — Emergency Department: Payer: Commercial Managed Care - PPO

## 2023-09-24 ENCOUNTER — Other Ambulatory Visit: Payer: Self-pay

## 2023-09-24 LAB — URINE DRUG SCREEN, QUALITATIVE (ARMC ONLY)
Amphetamines, Ur Screen: NOT DETECTED
Barbiturates, Ur Screen: NOT DETECTED
Benzodiazepine, Ur Scrn: NOT DETECTED
Cannabinoid 50 Ng, Ur ~~LOC~~: POSITIVE — AB
Cocaine Metabolite,Ur ~~LOC~~: NOT DETECTED
MDMA (Ecstasy)Ur Screen: NOT DETECTED
Methadone Scn, Ur: NOT DETECTED
Opiate, Ur Screen: NOT DETECTED
Phencyclidine (PCP) Ur S: NOT DETECTED
Tricyclic, Ur Screen: NOT DETECTED

## 2023-09-24 LAB — CBC WITH DIFFERENTIAL/PLATELET
Abs Immature Granulocytes: 0.05 10*3/uL (ref 0.00–0.07)
Basophils Absolute: 0 10*3/uL (ref 0.0–0.1)
Basophils Relative: 0 %
Eosinophils Absolute: 0 10*3/uL (ref 0.0–0.5)
Eosinophils Relative: 0 %
HCT: 48.7 % (ref 39.0–52.0)
Hemoglobin: 15.9 g/dL (ref 13.0–17.0)
Immature Granulocytes: 1 %
Lymphocytes Relative: 19 %
Lymphs Abs: 2 10*3/uL (ref 0.7–4.0)
MCH: 29.1 pg (ref 26.0–34.0)
MCHC: 32.6 g/dL (ref 30.0–36.0)
MCV: 89.2 fL (ref 80.0–100.0)
Monocytes Absolute: 0.6 10*3/uL (ref 0.1–1.0)
Monocytes Relative: 6 %
Neutro Abs: 7.8 10*3/uL — ABNORMAL HIGH (ref 1.7–7.7)
Neutrophils Relative %: 74 %
Platelets: 164 10*3/uL (ref 150–400)
RBC: 5.46 MIL/uL (ref 4.22–5.81)
RDW: 14.1 % (ref 11.5–15.5)
WBC: 10.6 10*3/uL — ABNORMAL HIGH (ref 4.0–10.5)
nRBC: 0 % (ref 0.0–0.2)

## 2023-09-24 LAB — COMPREHENSIVE METABOLIC PANEL
ALT: 22 U/L (ref 0–44)
AST: 27 U/L (ref 15–41)
Albumin: 4.2 g/dL (ref 3.5–5.0)
Alkaline Phosphatase: 67 U/L (ref 38–126)
Anion gap: 10 (ref 5–15)
BUN: 8 mg/dL (ref 6–20)
CO2: 22 mmol/L (ref 22–32)
Calcium: 8.3 mg/dL — ABNORMAL LOW (ref 8.9–10.3)
Chloride: 106 mmol/L (ref 98–111)
Creatinine, Ser: 1.03 mg/dL (ref 0.61–1.24)
GFR, Estimated: >60 mL/min (ref >60)
Glucose, Bld: 92 mg/dL (ref 70–99)
Potassium: 3.5 mmol/L (ref 3.5–5.1)
Sodium: 138 mmol/L (ref 135–145)
Total Bilirubin: 0.7 mg/dL (ref 0.3–1.2)
Total Protein: 7.4 g/dL (ref 6.5–8.1)

## 2023-09-24 LAB — TYPE AND SCREEN
ABO/RH(D): A POS
Antibody Screen: NEGATIVE

## 2023-09-24 LAB — ETHANOL: Alcohol, Ethyl (B): 183 mg/dL — ABNORMAL HIGH (ref <10)

## 2023-09-24 MED ORDER — TETANUS-DIPHTH-ACELL PERTUSSIS 5-2.5-18.5 LF-MCG/0.5 IM SUSY: 0.5000 mL | PREFILLED_SYRINGE | Freq: Once | INTRAMUSCULAR | Status: DC

## 2023-09-24 MED ORDER — IOHEXOL 300 MG/ML  SOLN
100.0000 mL | Freq: Once | INTRAMUSCULAR | Status: AC | PRN
  Administered 2023-09-24: 100 mL via INTRAVENOUS

## 2023-09-24 NOTE — ED Notes (Signed)
Pt told Clinical research associate he has to urinate. Writer obtained urinal for patient to use in bed and told him we should wait for imaging results to ensure he doesn't have any fractures prior to getting out of bed d/t risk of paralysis if fractures are present. Pt proceeded to move neck aggressively in every direction and say his neck is fine. Pt refused urinal and got out of bed to use toilet.

## 2023-09-24 NOTE — ED Notes (Signed)
Writer spoke with patient's mother on the phone and gave update. Pt gave permission to give mother updates.

## 2023-09-24 NOTE — ED Notes (Signed)
Patient transported to CT with RN and [employer]

## 2023-09-24 NOTE — ED Notes (Signed)
No need to replace IV to complete fluids per Larinda Buttery, MD

## 2023-09-24 NOTE — ED Notes (Signed)
EKG shown to MD Modesto Charon and MD Larinda Buttery

## 2024-01-23 ENCOUNTER — Other Ambulatory Visit: Payer: Self-pay

## 2024-01-23 ENCOUNTER — Emergency Department
Admission: EM | Admit: 2024-01-23 | Discharge: 2024-01-23 | Disposition: A | Discharge status: Home / Self Care | Payer: Commercial Managed Care - PPO | Attending: Emergency Medicine | Admitting: Emergency Medicine

## 2024-01-23 DIAGNOSIS — S0502XA Injury of conjunctiva and corneal abrasion without foreign body, left eye, initial encounter: Secondary | ICD-10-CM | POA: Insufficient documentation

## 2024-01-23 DIAGNOSIS — X58XXXA Exposure to other specified factors, initial encounter: Secondary | ICD-10-CM | POA: Insufficient documentation

## 2024-01-23 MED ORDER — FLUORESCEIN SODIUM 1 MG OP STRP
ORAL_STRIP | OPHTHALMIC | Status: AC
  Administered 2024-01-23: 1 via OPHTHALMIC
  Filled 2024-01-23: qty 1

## 2024-01-23 MED ORDER — FLUORESCEIN SODIUM 1 MG OP STRP
1.0000 | ORAL_STRIP | Freq: Once | OPHTHALMIC | Status: AC
  Administered 2024-01-23: 1 via OPHTHALMIC

## 2024-01-23 MED ORDER — OXYCODONE-ACETAMINOPHEN 5-325 MG PO TABS: 2.0000 | ORAL_TABLET | Freq: Four times a day (QID) | ORAL | 0 refills | Status: AC | PRN

## 2024-01-23 MED ORDER — OXYCODONE-ACETAMINOPHEN 5-325 MG PO TABS
2.0000 | ORAL_TABLET | Freq: Once | ORAL | Status: AC
  Administered 2024-01-23: 2 via ORAL
  Filled 2024-01-23: qty 2

## 2024-01-23 MED ORDER — ERYTHROMYCIN 5 MG/GM OP OINT
TOPICAL_OINTMENT | OPHTHALMIC | Status: AC
  Administered 2024-01-23: 1 via OPHTHALMIC
  Filled 2024-01-23: qty 1

## 2024-01-23 MED ORDER — TETRACAINE HCL 0.5 % OP SOLN
2.0000 [drp] | Freq: Once | OPHTHALMIC | Status: AC
  Administered 2024-01-23: 2 [drp] via OPHTHALMIC
  Filled 2024-01-23: qty 4

## 2024-01-23 NOTE — Discharge Instructions (Addendum)
Use the provided antibiotic ointment by placing a thin line of the ointment on a clean finger and rolling it onto your lower left eyelid.  When you blink and look around from side-to-side and up and down it will smear the ointment over your eye which will help protect it and keep it from getting infected.  Use the ointment every 6 hours while you are awake for the next 5 days, or until it completely stops hurting.  These injuries are very painful for the first 1 to 2 days.  You can use over-the-counter ibuprofen or Tylenol as needed for pain control.  Take Percocet as prescribed for severe pain. Do not drink alcohol, drive or participate in any other potentially dangerous activities while taking this medication as it may make you sleepy. Do not take this medication with any other sedating medications, either prescription or over-the-counter. If you were prescribed Percocet or Vicodin, do not take these with acetaminophen (Tylenol) as it is already contained within these medications.   This medication is an opiate (or narcotic) pain medication and can be habit forming.  Use it as little as possible to achieve adequate pain control.  Do not use or use it with extreme caution if you have a history of opiate abuse or dependence.  If you are on a pain contract with your primary care doctor or a pain specialist, be sure to let them know you were prescribed this medication today from the [facility] Emergency Department.  This medication is intended for your use only - do not give any to anyone else and keep it in a secure place where nobody else, especially children, have access to it.  It will also cause or worsen constipation, so you may want to consider taking an over-the-counter stool softener while you are taking this medication.  We strongly encourage you to call the office of Dr. Rolley Sims at the [facility] to schedule a follow-up appointment at the next available opportunity.    Return to the  emergency department if you develop new or worsening symptoms that concern you.
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
## 2024-02-22 ENCOUNTER — Other Ambulatory Visit: Payer: Self-pay

## 2024-02-22 DIAGNOSIS — S0502XA Injury of conjunctiva and corneal abrasion without foreign body, left eye, initial encounter: Secondary | ICD-10-CM | POA: Insufficient documentation

## 2024-02-22 DIAGNOSIS — H5712 Ocular pain, left eye: Secondary | ICD-10-CM | POA: Diagnosis present

## 2024-02-22 DIAGNOSIS — H11432 Conjunctival hyperemia, left eye: Secondary | ICD-10-CM | POA: Insufficient documentation

## 2024-02-22 DIAGNOSIS — W500XXA Accidental hit or strike by another person, initial encounter: Secondary | ICD-10-CM | POA: Insufficient documentation
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
## 2024-02-23 ENCOUNTER — Emergency Department
Admission: EM | Admit: 2024-02-23 | Discharge: 2024-02-23 | Disposition: A | Discharge status: Home / Self Care | Payer: Commercial Managed Care - PPO | Attending: Emergency Medicine | Admitting: Emergency Medicine

## 2024-02-23 DIAGNOSIS — H11432 Conjunctival hyperemia, left eye: Secondary | ICD-10-CM

## 2024-02-23 DIAGNOSIS — S0502XA Injury of conjunctiva and corneal abrasion without foreign body, left eye, initial encounter: Secondary | ICD-10-CM

## 2024-02-23 MED ORDER — FLUORESCEIN SODIUM 1 MG OP STRP
2.0000 | ORAL_STRIP | Freq: Once | OPHTHALMIC | Status: AC
  Administered 2024-02-23: 2 via OPHTHALMIC
  Filled 2024-02-23: qty 2

## 2024-02-23 MED ORDER — TETRACAINE HCL 0.5 % OP SOLN
2.0000 [drp] | Freq: Once | OPHTHALMIC | Status: AC
  Administered 2024-02-23: 2 [drp] via OPHTHALMIC
  Filled 2024-02-23: qty 4

## 2024-02-23 MED ORDER — ERYTHROMYCIN 5 MG/GM OP OINT: 1.0000 | TOPICAL_OINTMENT | Freq: Four times a day (QID) | OPHTHALMIC | 0 refills | Status: AC

## 2024-02-23 MED ORDER — ERYTHROMYCIN 5 MG/GM OP OINT
TOPICAL_OINTMENT | Freq: Once | OPHTHALMIC | Status: AC
  Filled 2024-02-23: qty 1

## 2024-02-23 MED ORDER — ACETAMINOPHEN 325 MG PO TABS: 650.0000 mg | ORAL_TABLET | Freq: Once | ORAL | Status: DC
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
